# Patient Record
Sex: Male | Born: 1950 | Race: White | Hispanic: No | Marital: Married | Smoking: Never smoker
Health system: Southern US, Community
[De-identification: ages and names within clinical notes are randomized; demographics above are authoritative.]

## PROBLEM LIST (undated history)

## (undated) DIAGNOSIS — F429 Obsessive-compulsive disorder, unspecified: Secondary | ICD-10-CM

## (undated) DIAGNOSIS — N400 Enlarged prostate without lower urinary tract symptoms: Secondary | ICD-10-CM

## (undated) DIAGNOSIS — E7800 Pure hypercholesterolemia, unspecified: Secondary | ICD-10-CM

## (undated) DIAGNOSIS — G4733 Obstructive sleep apnea (adult) (pediatric): Secondary | ICD-10-CM

## (undated) DIAGNOSIS — R7989 Other specified abnormal findings of blood chemistry: Secondary | ICD-10-CM

## (undated) HISTORY — PX: OTHER SURGICAL HISTORY: SHX169

---

## 2000-03-20 ENCOUNTER — Emergency Department (HOSPITAL_COMMUNITY): Admission: EM | Admit: 2000-03-20 | Discharge: 2000-03-20 | Payer: Self-pay | Admitting: Emergency Medicine

## 2000-03-20 ENCOUNTER — Encounter: Payer: Self-pay | Admitting: Emergency Medicine

## 2015-06-19 ENCOUNTER — Observation Stay (HOSPITAL_COMMUNITY)
Admission: EM | Admit: 2015-06-19 | Discharge: 2015-06-20 | Disposition: A | Discharge status: Home / Self Care | Payer: BC Managed Care – PPO | Attending: Surgery | Admitting: Surgery

## 2015-06-19 ENCOUNTER — Encounter (HOSPITAL_COMMUNITY): Payer: Self-pay | Admitting: Emergency Medicine

## 2015-06-19 ENCOUNTER — Encounter (HOSPITAL_COMMUNITY)
Admission: EM | Disposition: A | Discharge status: Home / Self Care | Payer: Self-pay | Source: Home / Self Care | Attending: Emergency Medicine

## 2015-06-19 ENCOUNTER — Observation Stay (HOSPITAL_COMMUNITY): Payer: BC Managed Care – PPO | Admitting: Anesthesiology

## 2015-06-19 DIAGNOSIS — G4733 Obstructive sleep apnea (adult) (pediatric): Secondary | ICD-10-CM

## 2015-06-19 DIAGNOSIS — Z79899 Other long term (current) drug therapy: Secondary | ICD-10-CM | POA: Diagnosis not present

## 2015-06-19 DIAGNOSIS — G4730 Sleep apnea, unspecified: Secondary | ICD-10-CM | POA: Diagnosis not present

## 2015-06-19 DIAGNOSIS — E785 Hyperlipidemia, unspecified: Secondary | ICD-10-CM | POA: Insufficient documentation

## 2015-06-19 DIAGNOSIS — R7989 Other specified abnormal findings of blood chemistry: Secondary | ICD-10-CM

## 2015-06-19 DIAGNOSIS — K4030 Unilateral inguinal hernia, with obstruction, without gangrene, not specified as recurrent: Principal | ICD-10-CM | POA: Diagnosis present

## 2015-06-19 DIAGNOSIS — N400 Enlarged prostate without lower urinary tract symptoms: Secondary | ICD-10-CM

## 2015-06-19 DIAGNOSIS — F429 Obsessive-compulsive disorder, unspecified: Secondary | ICD-10-CM | POA: Diagnosis present

## 2015-06-19 HISTORY — DX: Obstructive sleep apnea (adult) (pediatric): G47.33

## 2015-06-19 HISTORY — PX: INGUINAL HERNIA REPAIR: SHX194

## 2015-06-19 HISTORY — DX: Benign prostatic hyperplasia without lower urinary tract symptoms: N40.0

## 2015-06-19 HISTORY — DX: Other specified abnormal findings of blood chemistry: R79.89

## 2015-06-19 HISTORY — DX: Obsessive-compulsive disorder, unspecified: F42.9

## 2015-06-19 HISTORY — DX: Pure hypercholesterolemia, unspecified: E78.00

## 2015-06-19 LAB — COMPREHENSIVE METABOLIC PANEL
ALT: 22 U/L (ref 17–63)
AST: 22 U/L (ref 15–41)
Albumin: 4.6 g/dL (ref 3.5–5.0)
Alkaline Phosphatase: 69 U/L (ref 38–126)
Anion gap: 11 (ref 5–15)
BUN: 21 mg/dL — ABNORMAL HIGH (ref 6–20)
CO2: 27 mmol/L (ref 22–32)
Calcium: 9.4 mg/dL (ref 8.9–10.3)
Chloride: 105 mmol/L (ref 101–111)
Creatinine, Ser: 1.05 mg/dL (ref 0.61–1.24)
GFR calc Af Amer: >60 mL/min (ref >60)
GFR calc non Af Amer: >60 mL/min (ref >60)
Glucose, Bld: 121 mg/dL — ABNORMAL HIGH (ref 65–99)
Potassium: 4.1 mmol/L (ref 3.5–5.1)
Sodium: 143 mmol/L (ref 135–145)
Total Bilirubin: 0.7 mg/dL (ref 0.3–1.2)
Total Protein: 7.2 g/dL (ref 6.5–8.1)

## 2015-06-19 LAB — CBC
HCT: 45.8 % (ref 39.0–52.0)
Hemoglobin: 15.1 g/dL (ref 13.0–17.0)
MCH: 28.9 pg (ref 26.0–34.0)
MCHC: 33 g/dL (ref 30.0–36.0)
MCV: 87.6 fL (ref 78.0–100.0)
PLATELETS: 166 10*3/uL (ref 150–400)
RBC: 5.23 MIL/uL (ref 4.22–5.81)
RDW: 13.6 % (ref 11.5–15.5)
WBC: 5.5 10*3/uL (ref 4.0–10.5)

## 2015-06-19 LAB — LIPASE, BLOOD: Lipase: 151 U/L — ABNORMAL HIGH (ref 11–51)

## 2015-06-19 SURGERY — REPAIR, HERNIA, INGUINAL, ADULT
Anesthesia: General | Site: Groin | Laterality: Left

## 2015-06-19 MED ORDER — SODIUM CHLORIDE 0.9 % IV SOLN
INTRAVENOUS | Status: DC
  Administered 2015-06-19: 14:00:00 via INTRAVENOUS

## 2015-06-19 MED ORDER — PHENYLEPHRINE HCL 10 MG/ML IJ SOLN
INTRAMUSCULAR | Status: DC | PRN
  Administered 2015-06-19 (×5): 40 ug via INTRAVENOUS

## 2015-06-19 MED ORDER — DIPHENHYDRAMINE HCL 50 MG/ML IJ SOLN: 25.0000 mg | Freq: Four times a day (QID) | INTRAMUSCULAR | Status: DC | PRN

## 2015-06-19 MED ORDER — HYDROMORPHONE HCL 1 MG/ML IJ SOLN: 0.2500 mg | INTRAMUSCULAR | Status: DC | PRN

## 2015-06-19 MED ORDER — LIDOCAINE HCL (CARDIAC) 20 MG/ML IV SOLN
INTRAVENOUS | Status: DC | PRN
  Administered 2015-06-19: 100 mg via INTRAVENOUS

## 2015-06-19 MED ORDER — ONDANSETRON 4 MG PO TBDP: 4.0000 mg | ORAL_TABLET | Freq: Four times a day (QID) | ORAL | Status: DC | PRN

## 2015-06-19 MED ORDER — SUCCINYLCHOLINE CHLORIDE 20 MG/ML IJ SOLN
INTRAMUSCULAR | Status: DC | PRN
  Administered 2015-06-19: 100 mg via INTRAVENOUS

## 2015-06-19 MED ORDER — ONDANSETRON HCL 4 MG/2ML IJ SOLN
4.0000 mg | Freq: Once | INTRAMUSCULAR | Status: AC
  Administered 2015-06-19: 4 mg via INTRAVENOUS
  Filled 2015-06-19: qty 2

## 2015-06-19 MED ORDER — LACTATED RINGERS IV SOLN
INTRAVENOUS | Status: DC | PRN
  Administered 2015-06-19 (×2): via INTRAVENOUS

## 2015-06-19 MED ORDER — ONDANSETRON HCL 4 MG/2ML IJ SOLN: 4.0000 mg | Freq: Four times a day (QID) | INTRAMUSCULAR | Status: DC | PRN

## 2015-06-19 MED ORDER — 0.9 % SODIUM CHLORIDE (POUR BTL) OPTIME
TOPICAL | Status: DC | PRN
  Administered 2015-06-19: 2000 mL

## 2015-06-19 MED ORDER — SUGAMMADEX SODIUM 200 MG/2ML IV SOLN
INTRAVENOUS | Status: AC
  Filled 2015-06-19: qty 2

## 2015-06-19 MED ORDER — SUGAMMADEX SODIUM 200 MG/2ML IV SOLN
INTRAVENOUS | Status: DC | PRN
  Administered 2015-06-19: 200 mg via INTRAVENOUS

## 2015-06-19 MED ORDER — SODIUM CHLORIDE 0.9 % IJ SOLN
INTRAMUSCULAR | Status: DC | PRN
  Administered 2015-06-19: 10 mL

## 2015-06-19 MED ORDER — IBUPROFEN 600 MG PO TABS
600.0000 mg | ORAL_TABLET | Freq: Four times a day (QID) | ORAL | Status: DC | PRN
  Filled 2015-06-19: qty 1

## 2015-06-19 MED ORDER — LACTATED RINGERS IV SOLN: INTRAVENOUS | Status: DC

## 2015-06-19 MED ORDER — FENTANYL CITRATE (PF) 100 MCG/2ML IJ SOLN
INTRAMUSCULAR | Status: AC
  Filled 2015-06-19: qty 2

## 2015-06-19 MED ORDER — POTASSIUM CHLORIDE IN NACL 20-0.9 MEQ/L-% IV SOLN: INTRAVENOUS | Status: DC

## 2015-06-19 MED ORDER — BUPIVACAINE LIPOSOME 1.3 % IJ SUSP
20.0000 mL | Freq: Once | INTRAMUSCULAR | Status: AC
  Administered 2015-06-19: 20 mL
  Filled 2015-06-19: qty 20

## 2015-06-19 MED ORDER — CETYLPYRIDINIUM CHLORIDE 0.05 % MT LIQD
7.0000 mL | Freq: Two times a day (BID) | OROMUCOSAL | Status: DC
  Administered 2015-06-19: 7 mL via OROMUCOSAL

## 2015-06-19 MED ORDER — MIDAZOLAM HCL 5 MG/5ML IJ SOLN
INTRAMUSCULAR | Status: DC | PRN
  Administered 2015-06-19: 2 mg via INTRAVENOUS

## 2015-06-19 MED ORDER — DIPHENHYDRAMINE HCL 25 MG PO CAPS: 25.0000 mg | ORAL_CAPSULE | Freq: Four times a day (QID) | ORAL | Status: DC | PRN

## 2015-06-19 MED ORDER — FENTANYL CITRATE (PF) 100 MCG/2ML IJ SOLN
INTRAMUSCULAR | Status: DC | PRN
  Administered 2015-06-19: 100 ug via INTRAVENOUS

## 2015-06-19 MED ORDER — KCL IN DEXTROSE-NACL 20-5-0.45 MEQ/L-%-% IV SOLN
INTRAVENOUS | Status: DC
  Administered 2015-06-19: 20:00:00 via INTRAVENOUS
  Filled 2015-06-19 (×3): qty 1000

## 2015-06-19 MED ORDER — DEXAMETHASONE SODIUM PHOSPHATE 10 MG/ML IJ SOLN
INTRAMUSCULAR | Status: DC | PRN
  Administered 2015-06-19: 10 mg via INTRAVENOUS

## 2015-06-19 MED ORDER — PROPOFOL 10 MG/ML IV BOLUS
INTRAVENOUS | Status: DC | PRN
  Administered 2015-06-19: 200 mg via INTRAVENOUS

## 2015-06-19 MED ORDER — HYDROMORPHONE HCL 1 MG/ML IJ SOLN
1.0000 mg | Freq: Once | INTRAMUSCULAR | Status: AC
  Administered 2015-06-19: 1 mg via INTRAVENOUS
  Filled 2015-06-19: qty 1

## 2015-06-19 MED ORDER — ROCURONIUM BROMIDE 100 MG/10ML IV SOLN
INTRAVENOUS | Status: DC | PRN
  Administered 2015-06-19: 40 mg via INTRAVENOUS
  Administered 2015-06-19: 10 mg via INTRAVENOUS
  Administered 2015-06-19: 30 mg via INTRAVENOUS

## 2015-06-19 MED ORDER — ONDANSETRON HCL 4 MG/2ML IJ SOLN
4.0000 mg | Freq: Four times a day (QID) | INTRAMUSCULAR | Status: DC | PRN
  Administered 2015-06-19: 4 mg via INTRAVENOUS

## 2015-06-19 MED ORDER — SODIUM CHLORIDE 0.9 % IJ SOLN
INTRAMUSCULAR | Status: AC
  Filled 2015-06-19: qty 50

## 2015-06-19 MED ORDER — MORPHINE SULFATE (PF) 10 MG/ML IV SOLN: 1.0000 mg | INTRAVENOUS | Status: DC | PRN

## 2015-06-19 MED ORDER — CEFAZOLIN SODIUM-DEXTROSE 2-3 GM-% IV SOLR
INTRAVENOUS | Status: AC
  Filled 2015-06-19: qty 50

## 2015-06-19 MED ORDER — HYDROMORPHONE HCL 1 MG/ML IJ SOLN: 1.0000 mg | INTRAMUSCULAR | Status: DC | PRN

## 2015-06-19 MED ORDER — FENTANYL CITRATE (PF) 250 MCG/5ML IJ SOLN
INTRAMUSCULAR | Status: AC
  Filled 2015-06-19: qty 5

## 2015-06-19 MED ORDER — MIDAZOLAM HCL 2 MG/2ML IJ SOLN
INTRAMUSCULAR | Status: AC
  Filled 2015-06-19: qty 2

## 2015-06-19 MED ORDER — HYDROCODONE-ACETAMINOPHEN 5-325 MG PO TABS: 1.0000 | ORAL_TABLET | ORAL | Status: DC | PRN

## 2015-06-19 MED ORDER — CEFAZOLIN SODIUM-DEXTROSE 2-3 GM-% IV SOLR
2.0000 g | INTRAVENOUS | Status: AC
  Administered 2015-06-19: 2 g via INTRAVENOUS
  Filled 2015-06-19: qty 50

## 2015-06-19 SURGICAL SUPPLY — 42 items
BENZOIN TINCTURE PRP APPL 2/3 (GAUZE/BANDAGES/DRESSINGS) ×3 IMPLANT
BLADE HEX COATED 2.75 (ELECTRODE) ×3 IMPLANT
BLADE SURG 15 STRL LF DISP TIS (BLADE) IMPLANT
BLADE SURG 15 STRL SS (BLADE)
BLADE SURG SZ10 CARB STEEL (BLADE) ×6 IMPLANT
CLOSURE WOUND 1/2 X4 (GAUZE/BANDAGES/DRESSINGS) ×1
COVER SURGICAL LIGHT HANDLE (MISCELLANEOUS) ×3 IMPLANT
DECANTER SPIKE VIAL GLASS SM (MISCELLANEOUS) ×3 IMPLANT
DISSECTOR ROUND CHERRY 3/8 STR (MISCELLANEOUS) ×3 IMPLANT
DRAIN PENROSE 18X1/2 LTX STRL (DRAIN) ×3 IMPLANT
DRAPE LAPAROTOMY TRNSV 102X78 (DRAPE) ×3 IMPLANT
ELECT PENCIL ROCKER SW 15FT (MISCELLANEOUS) ×3 IMPLANT
ELECT REM PT RETURN 9FT ADLT (ELECTROSURGICAL) ×3
ELECTRODE REM PT RTRN 9FT ADLT (ELECTROSURGICAL) ×1 IMPLANT
GAUZE SPONGE 4X4 12PLY STRL (GAUZE/BANDAGES/DRESSINGS) ×3 IMPLANT
GLOVE BIOGEL PI IND STRL 7.0 (GLOVE) ×1 IMPLANT
GLOVE BIOGEL PI INDICATOR 7.0 (GLOVE) ×2
GLOVE SURG SIGNA 7.5 PF LTX (GLOVE) ×3 IMPLANT
GOWN SPEC L4 XLG W/TWL (GOWN DISPOSABLE) ×3 IMPLANT
GOWN STRL REUS W/ TWL XL LVL3 (GOWN DISPOSABLE) ×3 IMPLANT
GOWN STRL REUS W/TWL LRG LVL3 (GOWN DISPOSABLE) ×3 IMPLANT
GOWN STRL REUS W/TWL XL LVL3 (GOWN DISPOSABLE) ×6
KIT BASIN OR (CUSTOM PROCEDURE TRAY) ×3 IMPLANT
LIQUID BAND (GAUZE/BANDAGES/DRESSINGS) ×3 IMPLANT
MESH ULTRAPRO 3X6 7.6X15CM (Mesh General) ×3 IMPLANT
NEEDLE HYPO 25X1 1.5 SAFETY (NEEDLE) ×3 IMPLANT
NS IRRIG 1000ML POUR BTL (IV SOLUTION) ×3 IMPLANT
PACK BASIC VI WITH GOWN DISP (CUSTOM PROCEDURE TRAY) ×3 IMPLANT
SPONGE LAP 18X18 X RAY DECT (DISPOSABLE) ×3 IMPLANT
SPONGE LAP 4X18 X RAY DECT (DISPOSABLE) ×3 IMPLANT
STRIP CLOSURE SKIN 1/2X4 (GAUZE/BANDAGES/DRESSINGS) ×2 IMPLANT
SUT CHROMIC 0 SH (SUTURE) IMPLANT
SUT MNCRL AB 4-0 PS2 18 (SUTURE) IMPLANT
SUT NOVA 0 T19/GS 22DT (SUTURE) ×9 IMPLANT
SUT VIC AB 0 CT2 27 (SUTURE) ×3 IMPLANT
SUT VIC AB 2-0 SH 27 (SUTURE) ×6
SUT VIC AB 2-0 SH 27X BRD (SUTURE) ×3 IMPLANT
SUT VIC AB 3-0 SH 18 (SUTURE) ×3 IMPLANT
SYR BULB IRRIGATION 50ML (SYRINGE) ×3 IMPLANT
SYR CONTROL 10ML LL (SYRINGE) ×3 IMPLANT
TOWEL OR 17X26 10 PK STRL BLUE (TOWEL DISPOSABLE) ×3 IMPLANT
YANKAUER SUCT BULB TIP 10FT TU (MISCELLANEOUS) ×3 IMPLANT

## 2015-06-19 NOTE — Op Note (Signed)
06/19/2015  5:48 PM  PATIENT:  Jim Campbell, 65 y.o., male, MRN: [id number]  PREOP DIAGNOSIS:  Incarcerated left inguinal hernia  POSTOP DIAGNOSIS:   Incarcerated left inguinal hernia  PROCEDURE:   Procedure(s):  LEFT INGUINAL HERNIA REPAIR  WITH MESH  SURGEON:   Alphonsa Overall, M.D.  ANESTHESIA:   general  Anesthesiologist: Rod Mae, MD CRNA: Glory Buff, CRNA  General  EBL:  minimal  ml  LOCAL MEDICATIONS USED:   20 cc Exparel (mixed with 10 cc of saline)  SPECIMEN:   Incarcerated preperitoneal fat  COUNTS CORRECT:  YES  INDICATIONS FOR PROCEDURE:  [name] is a 65 y.o. (DOB: 01/25/1951) white  male whose primary care physician is GATES,ROBERT NEVILL, MD and comes for open left inguinal hernia.   He presented to the [facility] with an incarcerated left inguinal hernia.  He said that his symptoms began today.   The indications and risks of the hernia surgery were explained to the patient.  The risks include, but are not limited to, infection, bleeding, recurrence of the hernia, and nerve injury.  Operative Note: The patient was taken to room number 1 at [facility].  He underwent a general anesthesia.  After he was under general anesthesia, I was able to reduce the left inguinal hernia.  A time out was held and the surgical checklist run.  His lower abdomen was shaved and then prepped with chloroprep.  A left inguinal incision was made through the subcutaneous fat to the external oblique fascia.  The external ring was opened and the cord structures encircled with a penrose drain.  The patient had an indirect left inguinal hernia.  I identified the ileo inguinal nerve and preserved this during the dissection.  The external ring was opened and the cord structures encircled with a penrose drain.  The patient had an indirect inguinal hernia. The patient had a large mass of preperitoneal fat with hemorrhage that made up most of the hernia.  There was a small sac the came  along the anterior medial portion of the cord structures, but what I found was mainly fat (lipoma).  There was no  sac to dissect.  The fat (lipoma) was amputated with 2-0 vicryl.  He had no evidence of a direct inguinal hernia.  The inguinal floor was repaired with a 3 x 6 inch piece of Ultrapro mesh.  The mesh was cut to fit the inguinal floor.  The mesh was sewn in place with interrupted 0 Novafil suture.  A key hole was made for the internal ring.  The mesh lay flat.  The inguinal floor was covered and the internal ring recreated.  The cord structures were returned to a normal location.  The external oblique was closed with a 3-0 vicryl.  The fascia and subcutaneous tissues were infiltrated with 20 cc of Exparel (mixed with 10 cc of saline).  The skin was closed with 4-0 monocryl and painted with LiquidBand. The sponge and needle count were correct at the end of the case.  The patient was transported to the recovery room in good condition.  I will keep the patient overnight because of the incarcerated portion of his presentation.  Alphonsa Overall, MD, [facility] Surgery Pager: [phone] Office phone:  [phone]

## 2015-06-19 NOTE — Transfer of Care (Signed)
Immediate Anesthesia Transfer of Care Note  Patient: Jim Campbell  Procedure(s) Performed: Procedure(s): LEFT INGUINAL HERNIA REPAIR  WITH MESH (Left)  Patient Location: PACU  Anesthesia Type:General  Level of Consciousness: awake, alert  and oriented  Airway & Oxygen Therapy: Patient Spontanous Breathing and Patient connected to face mask oxygen  Post-op Assessment: Report given to RN and Post -op Vital signs reviewed and stable  Post vital signs: Reviewed and stable  Last Vitals:  Filed Vitals:   06/19/15 1351 06/19/15 1506  BP: 130/82 135/81  Pulse: 80 77  Temp: 36.8 C   Resp: 18 16    Complications: No apparent anesthesia complications

## 2015-06-19 NOTE — ED Notes (Signed)
Rn starting IV and drawing labs

## 2015-06-19 NOTE — ED Provider Notes (Signed)
CSN: [id number]     Arrival date & time 06/19/15  1336 History   First MD Initiated Contact with Patient 06/19/15 1359     Chief Complaint  Patient presents with  . Abdominal Pain  . Testicle Pain     (Consider location/radiation/quality/duration/timing/severity/associated sxs/prior Treatment) HPI Comments: Patient here complaining of sudden onset of left groin swelling that began approximately 9:30 this morning. Denies any hematuria or dysuria. Follow a large hard mass that started in his left inguinal canal and goes down to his left scrotum. No prior history of same. Denies any prior history of varicoceles or hydroceles Pain is characterized as sharp and worse with movement. Denies any flank pain. No vomiting. No tracheal use prior to arrival  Patient is a 65 y.o. male presenting with abdominal pain and testicular pain. The history is provided by the patient and the spouse.  Abdominal Pain Testicle Pain Associated symptoms include abdominal pain.    Past Medical History  Diagnosis Date  . High cholesterol    History reviewed. No pertinent past surgical history. History reviewed. No pertinent family history. Social History  Substance Use Topics  . Smoking status: None  . Smokeless tobacco: None  . Alcohol Use: None    Review of Systems  Gastrointestinal: Positive for abdominal pain.  Genitourinary: Positive for testicular pain.  All other systems reviewed and are negative.     Allergies  Review of patient's allergies indicates no known allergies.  Home Medications   Prior to Admission medications   Not on File   BP 130/82 mmHg  Pulse 80  Temp(Src) 98.2 F (36.8 C) (Oral)  Resp 18  SpO2 94% Physical Exam  Constitutional: He is oriented to person, place, and time. He appears well-developed and well-nourished.  Non-toxic appearance. No distress.  HENT:  Head: Normocephalic and atraumatic.  Eyes: Conjunctivae, EOM and lids are normal. Pupils are equal, round, and  reactive to light.  Neck: Normal range of motion. Neck supple. No tracheal deviation present. No thyroid mass present.  Cardiovascular: Normal rate, regular rhythm and normal heart sounds.  Exam reveals no gallop.   No murmur heard. Pulmonary/Chest: Effort normal and breath sounds normal. No stridor. No respiratory distress. He has no decreased breath sounds. He has no wheezes. He has no rhonchi. He has no rales.  Abdominal: Soft. Normal appearance and bowel sounds are normal. He exhibits no distension. There is no tenderness. There is no rebound and no CVA tenderness. A hernia is present. Hernia confirmed positive in the left inguinal area.  Genitourinary:    Left testis shows mass, swelling and tenderness. Circumcised.  Musculoskeletal: Normal range of motion. He exhibits no edema or tenderness.  Lymphadenopathy:       Left: No inguinal adenopathy present.  Neurological: He is alert and oriented to person, place, and time. He has normal strength. No cranial nerve deficit or sensory deficit. GCS eye subscore is 4. GCS verbal subscore is 5. GCS motor subscore is 6.  Skin: Skin is warm and dry. No abrasion and no rash noted.  Psychiatric: He has a normal mood and affect. His speech is normal and behavior is normal.  Nursing note and vitals reviewed.   ED Course  Procedures (including critical care time) Labs Review Labs Reviewed  LIPASE, BLOOD  COMPREHENSIVE METABOLIC PANEL  CBC  URINALYSIS, ROUTINE W REFLEX MICROSCOPIC (NOT AT [facility])    Imaging Review No results found. I have personally reviewed and evaluated these images and lab results as part  of my medical decision-making.   EKG Interpretation None      MDM   Final diagnoses:  None    Urgent consult placed to general surgery for incarcerated inguinal hernia. They have seen the patient and he is to go to the operating room    Lacretia Leigh, MD 06/19/15 1520

## 2015-06-19 NOTE — ED Notes (Signed)
While at work patient twisted to look at something then felt a large amount of pain to the left groin/abdominal/testicle area that has been steadily increasing since. Pt states he can feel a large hard mass to groin that he believes is a hernia.

## 2015-06-19 NOTE — H&P (Signed)
Jim Campbell is an 65 y.o. male.   PCP: [name] Chief Complaint: left groin pain  HPI:  Pt at work this AM and twisted then felt large amount of pain, left groin, abdomen and testicle.  Mass is hard and concerned he had a hernia.  Symptoms started around 8:30-9:30 PM.    He never had pain or was aware of issues here before this AM, he is quite sure he was normal this AM with his shower.   Seen in the ED by Dr. Lacretia Leigh and based on exam is concerned he has an incarcerated LIH. On exam now he has a large tender, non reducible mass in the scrotum.    Past Medical History  Diagnosis Date  . High cholesterol Sleep apnea with CPAP OCD Low testosterone      Past Surgical History  Procedure Laterality Date  . None      Family History  Problem Relation Age of Onset  . Memory loss Mother    Social History:  has no tobacco, alcohol, and drug history on file.  Allergies: No Known Allergies  Prior to Admission medications   Medication Sig Start Date End Date Taking? Authorizing Provider  ezetimibe (ZETIA) 10 MG tablet Take 10 mg by mouth daily. 06/01/15  Yes Historical Provider, MD  fluvoxaMINE (LUVOX) 25 MG tablet Take 50 mg by mouth at bedtime. 04/05/15  Yes Historical Provider, MD  rosuvastatin (CRESTOR) 20 MG tablet Take 20 mg by mouth daily. 06/01/15  Yes Historical Provider, MD  Testosterone 12.5 MG/ACT (1%) GEL 5 pumps once daily as directed 06/07/15  Yes Historical Provider, MD  CIALIS 5 MG tablet Take 5 mg by mouth daily as needed. 03/24/15   Historical Provider, MD     Results for orders placed or performed during the hospital encounter of 06/19/15 (from the past 48 hour(s))  CBC     Status: None   Collection Time: 06/19/15  2:39 PM  Result Value Ref Range   WBC 5.5 4.0 - 10.5 K/uL   RBC 5.23 4.22 - 5.81 MIL/uL   Hemoglobin 15.1 13.0 - 17.0 g/dL   HCT 45.8 39.0 - 52.0 %   MCV 87.6 78.0 - 100.0 fL   MCH 28.9 26.0 - 34.0 pg   MCHC 33.0 30.0 - 36.0 g/dL   RDW  13.6 11.5 - 15.5 %   Platelets 166 150 - 400 K/uL   No results found.  Review of Systems  Constitutional: Negative.   Eyes: Negative.   Respiratory: Negative.        Sleep apnea with CPAP  Cardiovascular: Negative.   Gastrointestinal: Positive for abdominal pain (Pain from testicle up to LLQ). Negative for heartburn, nausea, vomiting, diarrhea, constipation, blood in stool and melena.  Genitourinary: Negative.        Slow stream with voiding.  Musculoskeletal: Positive for neck pain (see chiropracter for this, much better).  Neurological: Negative.   Endo/Heme/Allergies: Negative.   Psychiatric/Behavioral: Negative.        Hx of OCD    Blood pressure 135/81, pulse 77, temperature 98.2 F (36.8 C), temperature source Oral, resp. rate 16, SpO2 99 %. Physical Exam  Constitutional: He is oriented to person, place, and time. He appears well-developed and well-nourished. No distress.  He is very tender Left groin and testicle  HENT:  Head: Normocephalic and atraumatic.  Nose: Nose normal.  Eyes: Conjunctivae and EOM are normal. Right eye exhibits no discharge. Left eye exhibits no discharge. No scleral  icterus.  Neck: Neck supple. No JVD present. No tracheal deviation present. No thyromegaly present.  Cardiovascular: Normal rate, regular rhythm, normal heart sounds and intact distal pulses.   No murmur heard. Respiratory: Effort normal and breath sounds normal. No respiratory distress. He has no wheezes. He has no rales. He exhibits no tenderness.  GI: Soft. Bowel sounds are normal. He exhibits no distension (he is tender Left lower groin going down to his testicle) and no mass. There is tenderness. There is no rebound and no guarding.  Genitourinary:  Tenderness and swelling left groin, with hard 10+ cm of bowel in the scrotum. I cannot reduce it, nor could Dr. Zenia Resides.    Musculoskeletal: He exhibits no edema or tenderness.  Lymphadenopathy:    He has no cervical adenopathy.   Neurological: He is alert and oriented to person, place, and time. No cranial nerve deficit.  Skin: Skin is warm and dry. No rash noted. He is not diaphoretic. No erythema. No pallor.  Psychiatric: He has a normal mood and affect. His behavior is normal. Judgment and thought content normal.     Assessment/Plan Incarcerated LIH OCD Sleep apnea with CPAP Hyperlipidemia  Plan:  He has been seen and examined by Dr. Lucia Gaskins, he as not able to reduce the hernia either.  We plan to take him to the OR for open repair and possible laparoscopy ASAP.  [name],[name], PA-C 06/19/2015, 3:13 PM  Agree with above. His wife is at the bedside.  I discussed the indications and complications of hernia surgery with the patient.  I discussed both the laparoscopic and open approach to hernia repair.  I think that he is best served with an open operation. The potential risks of hernia surgery include, but are not limited to, bleeding, infection, open surgery, nerve injury, and recurrence of the hernia.    Alphonsa Overall, MD, [facility] Surgery Pager: [phone] Office phone:  [phone]

## 2015-06-19 NOTE — Anesthesia Preprocedure Evaluation (Addendum)
Anesthesia Evaluation  Patient identified by MRN, date of birth, ID band Patient awake    Reviewed: Allergy & Precautions, H&P , NPO status , Patient's Chart, lab work & pertinent test results  Airway Mallampati: II  TM Distance: >3 FB Neck ROM: full    Dental no notable dental hx. (+) Dental Advisory Given, Teeth Intact   Pulmonary neg pulmonary ROS, sleep apnea and Continuous Positive Airway Pressure Ventilation ,    Pulmonary exam normal breath sounds clear to auscultation       Cardiovascular Exercise Tolerance: Good negative cardio ROS Normal cardiovascular exam Rhythm:regular Rate:Normal     Neuro/Psych negative neurological ROS  negative psych ROS   GI/Hepatic negative GI ROS, Neg liver ROS,   Endo/Other  negative endocrine ROS  Renal/GU negative Renal ROS  negative genitourinary   Musculoskeletal   Abdominal   Peds  Hematology negative hematology ROS (+)   Anesthesia Other Findings   Reproductive/Obstetrics negative OB ROS                             Anesthesia Physical Anesthesia Plan  ASA: III  Anesthesia Plan: General   Post-op Pain Management:    Induction: Intravenous  Airway Management Planned: Nasal ETT  Additional Equipment:   Intra-op Plan:   Post-operative Plan: Extubation in OR  Informed Consent: I have reviewed the patients History and Physical, chart, labs and discussed the procedure including the risks, benefits and alternatives for the proposed anesthesia with the patient or authorized representative who has indicated his/her understanding and acceptance.   Dental Advisory Given  Plan Discussed with: CRNA and Surgeon  Anesthesia Plan Comments:        Anesthesia Quick Evaluation

## 2015-06-19 NOTE — Anesthesia Postprocedure Evaluation (Signed)
Anesthesia Post Note  Patient: Jim Campbell  Procedure(s) Performed: Procedure(s) (LRB): LEFT INGUINAL HERNIA REPAIR  WITH MESH (Left)  Patient location during evaluation: PACU Anesthesia Type: General Level of consciousness: awake and alert Pain management: pain level controlled Vital Signs Assessment: post-procedure vital signs reviewed and stable Respiratory status: spontaneous breathing, nonlabored ventilation, respiratory function stable and patient connected to nasal cannula oxygen Cardiovascular status: blood pressure returned to baseline and stable Postop Assessment: no signs of nausea or vomiting Anesthetic complications: no    Last Vitals:  Filed Vitals:   06/19/15 1940 06/19/15 2100  BP: 121/77 126/75  Pulse: 88 89  Temp: 36.8 C 36.5 C  Resp: 16 18    Last Pain:  Filed Vitals:   06/19/15 2115  PainSc: 4                  Magnus Crescenzo L

## 2015-06-19 NOTE — Anesthesia Procedure Notes (Signed)
Procedure Name: Intubation Date/Time: 06/19/2015 3:49 PM Performed by: Glory Buff Pre-anesthesia Checklist: Patient identified, Emergency Drugs available, Suction available and Patient being monitored Patient Re-evaluated:Patient Re-evaluated prior to inductionOxygen Delivery Method: Circle System Utilized Preoxygenation: Pre-oxygenation with 100% oxygen Intubation Type: IV induction Ventilation: Mask ventilation without difficulty Laryngoscope Size: Miller and 3 Grade View: Grade III Tube type: Oral Tube size: 7.5 mm Number of attempts: 2 Airway Equipment and Method: Oral airway and Bougie stylet Placement Confirmation: ETT inserted through vocal cords under direct vision,  positive ETCO2 and breath sounds checked- equal and bilateral Secured at: 22 cm Tube secured with: Tape Dental Injury: Teeth and Oropharynx as per pre-operative assessment  Difficulty Due To: Difficulty was unanticipated and Difficult Airway- due to anterior larynx Future Recommendations: Recommend- induction with short-acting agent, and alternative techniques readily available

## 2015-06-20 ENCOUNTER — Encounter (HOSPITAL_COMMUNITY): Payer: Self-pay | Admitting: Surgery

## 2015-06-20 ENCOUNTER — Encounter: Payer: Self-pay | Admitting: General Surgery

## 2015-06-20 LAB — CBC WITH DIFFERENTIAL/PLATELET
Basophils Absolute: 0 10*3/uL (ref 0.0–0.1)
Basophils Relative: 0 %
EOS PCT: 0 %
Eosinophils Absolute: 0 10*3/uL (ref 0.0–0.7)
HEMATOCRIT: 45 % (ref 39.0–52.0)
Hemoglobin: 14.6 g/dL (ref 13.0–17.0)
LYMPHS ABS: 0.8 10*3/uL (ref 0.7–4.0)
LYMPHS PCT: 7 %
MCH: 28.7 pg (ref 26.0–34.0)
MCHC: 32.4 g/dL (ref 30.0–36.0)
MCV: 88.4 fL (ref 78.0–100.0)
MONO ABS: 0.6 10*3/uL (ref 0.1–1.0)
MONOS PCT: 5 %
NEUTROS ABS: 9.7 10*3/uL — AB (ref 1.7–7.7)
Neutrophils Relative %: 88 %
Platelets: 177 10*3/uL (ref 150–400)
RBC: 5.09 MIL/uL (ref 4.22–5.81)
RDW: 13.8 % (ref 11.5–15.5)
WBC: 11 10*3/uL — ABNORMAL HIGH (ref 4.0–10.5)

## 2015-06-20 MED ORDER — HYDROCODONE-ACETAMINOPHEN 5-325 MG PO TABS: 1.0000 | ORAL_TABLET | ORAL | Status: DC | PRN

## 2015-06-20 MED ORDER — IBUPROFEN 200 MG PO TABS: ORAL_TABLET | ORAL | Status: DC

## 2015-06-20 MED ORDER — ACETAMINOPHEN 325 MG PO TABS
650.0000 mg | ORAL_TABLET | Freq: Four times a day (QID) | ORAL | Status: DC | PRN
Start: 2015-06-20 — End: 2024-03-25

## 2015-06-20 NOTE — Progress Notes (Signed)
1 Day Post-Op  Subjective: He feels fine ate a regular breakfast and not having much pain.  Objective: Vital signs in last 24 hours: Temp:  [97.5 F (36.4 C)-99.6 F (37.6 C)] 97.5 F (36.4 C) (02/07 0532) Pulse Rate:  [77-96] 96 (02/07 0532) Resp:  [15-19] 18 (02/07 0532) BP: (113-141)/(67-87) 129/69 mmHg (02/07 0532) SpO2:  [94 %-100 %] 96 % (02/07 0532) Weight:  [93.9 kg (207 lb 0.2 oz)] 93.9 kg (207 lb 0.2 oz) (02/06 1906) Last BM Date: 06/18/15 Nothing PO recorded  Regular diet 2400 urine+ Afebrile, VSS WBC up some 11K Intake/Output from previous day: 02/06 0701 - 02/07 0700 In: 1325 [I.V.:1325] Out: 2420 [Urine:2400; Blood:20] Intake/Output this shift:    General appearance: alert, cooperative and no distress GI: soft sore, site looks great.    + BS regular breakfast this AM.  Lab Results:   Recent Labs  06/19/15 1439 06/20/15 0454  WBC 5.5 11.0*  HGB 15.1 14.6  HCT 45.8 45.0  PLT 166 177    BMET  Recent Labs  06/19/15 1439  NA 143  K 4.1  CL 105  CO2 27  GLUCOSE 121*  BUN 21*  CREATININE 1.05  CALCIUM 9.4   PT/INR No results for input(s): LABPROT, INR in the last 72 hours.   Recent Labs Lab 06/19/15 1439  AST 22  ALT 22  ALKPHOS 69  BILITOT 0.7  PROT 7.2  ALBUMIN 4.6     Lipase     Component Value Date/Time   LIPASE 151* 06/19/2015 1439     Studies/Results: No results found.  Medications: . antiseptic oral rinse  7 mL Mouth Rinse BID    Assessment/Plan Incarcerated LIH  LEFT INGUINAL HERNIA REPAIR WITH MESH, 06/19/15 Dr. Lucia Gaskins  OCD Sleep apnea with CPAP Hyperlipidemia Antibiotics:  Pre op only DVT:  SCD    Plan:  Home today follow up with Dr. Lucia Gaskins in 2-3 weeks.       Campbell,Jim 06/20/2015  Agree with above. 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  Alphonsa Overall, MD, [facility] Pager: [phone] Office phone:  [phone]

## 2015-06-20 NOTE — Progress Notes (Signed)
Patient's vitals were WNL, tolerating diet and pain was under control. Discussed discharge instructions with both patient and wife. Discharged home with prescriptions.

## 2015-06-20 NOTE — Discharge Instructions (Signed)
[facility] _______[facility]  UMBILICAL OR INGUINAL HERNIA REPAIR: POST OP INSTRUCTIONS  Always review your discharge instruction sheet given to you by the facility where your surgery was performed. IF YOU HAVE DISABILITY OR FAMILY LEAVE FORMS, YOU MUST BRING THEM TO THE OFFICE FOR PROCESSING.   DO NOT GIVE THEM TO YOUR DOCTOR.  1. A  prescription for pain medication may be given to you upon discharge.  Take your pain medication as prescribed, if needed.  If narcotic pain medicine is not needed, then you may take acetaminophen (Tylenol) or ibuprofen (Advil) as needed. 2. Take your usually prescribed medications unless otherwise directed. 3. If you need a refill on your pain medication, please contact your pharmacy.  They will contact our office to request authorization. Prescriptions will not be filled after 5 pm or on week-ends. 4. You should follow a light diet the first 24 hours after arrival home, such as soup and crackers, etc.  Be sure to include lots of fluids daily.  Resume your normal diet the day after surgery. 5. Most patients will experience some swelling and bruising around the umbilicus or in the groin and scrotum.  Ice packs and reclining will help.  Swelling and bruising can take several days to resolve.  6. It is common to experience some constipation if taking pain medication after surgery.  Increasing fluid intake and taking a stool softener (such as Colace) will usually help or prevent this problem from occurring.  A mild laxative (Milk of Magnesia or Miralax) should be taken according to package directions if there are no bowel movements after 48 hours. 7. Unless discharge instructions indicate otherwise, you may remove your bandages 24-48 hours after surgery, and you may shower at that time.  You may have steri-strips (small skin tapes) in place directly over the incision.  These strips should be left on the skin for 7-10 days.  If your surgeon used skin glue on the  incision, you may shower in 24 hours.  The glue will flake off over the next 2-3 weeks.  Any sutures or staples will be removed at the office during your follow-up visit. 8. ACTIVITIES:  You may resume regular (light) daily activities beginning the next day--such as daily self-care, walking, climbing stairs--gradually increasing activities as tolerated.  You may have sexual intercourse when it is comfortable.  Refrain from any heavy lifting or straining until approved by your doctor. a. You may drive when you are no longer taking prescription pain medication, you can comfortably wear a seatbelt, and you can safely maneuver your car and apply brakes. b. RETURN TO WORK:  __________________________________________________________ 9. You should see your doctor in the office for a follow-up appointment approximately 2-3 weeks after your surgery.  Make sure that you call for this appointment within a day or two after you arrive home to insure a convenient appointment time. 10. OTHER INSTRUCTIONS:  __________________________________________________________________________________________________________________________________________________________________________________________  WHEN TO CALL YOUR DOCTOR: 1. Fever over 101.0 2. Inability to urinate 3. Nausea and/or vomiting 4. Extreme swelling or bruising 5. Continued bleeding from incision. 6. Increased pain, redness, or drainage from the incision  The clinic staff is available to answer your questions during regular business hours.  Please don't hesitate to call and ask to speak to one of the nurses for clinical concerns.  If you have a medical emergency, go to the nearest emergency room or call 911.  A surgeon from [facility] is always on call at the hospital     [address], [address], [place], [place]  [address] ?  [address] [address], [place],    [address] [phone] ? [phone] ? FAX [phone] Web site:  [email or web address]  

## 2015-06-20 NOTE — Care Management Note (Signed)
Case Management Note  Patient Details  Name: Jim Campbell MRN: [id number] Date of Birth: 01-01-1951  Subjective/Objective:    Admitted with Incarcerated left inguinal hernia, s/p repair                Action/Plan: Discharge planning, no HH needs identified  Expected Discharge Date:                  Expected Discharge Plan:     In-House Referral:     Discharge planning Services     Post Acute Care Choice:    Choice offered to:     DME Arranged:    DME Agency:     HH Arranged:    [place] Agency:     Status of Service:     Medicare Important Message Given:    Date Medicare IM Given:    Medicare IM give by:    Date Additional Medicare IM Given:    Additional Medicare Important Message give by:     If discussed at [place] of Stay Meetings, dates discussed:    Additional Comments:  Guadalupe Maple, RN 06/20/2015, 11:39 AM [phone]

## 2015-06-22 NOTE — Discharge Summary (Signed)
Physician Discharge Summary  Patient ID: Jim Campbell MRN: [id number] DOB/AGE: 10/01/1950 65 y.o.  Admit date: 06/19/2015 Discharge date: 06/20/2015  Admission Diagnoses:  Incarcerated LIH OCD Sleep apnea with CPAP Hyperlipidemia  Discharge Diagnoses:  Same    Principal Problem:   Incarcerated left inguinal hernia Active Problems:   OCD (obsessive compulsive disorder)   Sleep apnea, obstructive   Low testosterone   BPH (benign prostatic hyperplasia)   PROCEDURES: Left inguinal hernia repair with Mesh 06/19/15, Dr. Darci Current Course: Pt at work this AM and twisted then felt large amount of pain, left groin, abdomen and testicle. Mass is hard and concerned he had a hernia. Symptoms started around 8:30-9:30 PM. He never had pain or was aware of issues here before this AM, he is quite sure he was normal this AM with his shower.  Seen in the ED by Dr. Lacretia Leigh and based on exam is concerned he has an incarcerated LIH. On exam now he has a large tender, non reducible mass in the scrotum.   He was admitted and taken to the OR by Dr. Lucia Gaskins.  The hernia reduced easily once he was under general anesthesia.  The hernia was repaired and he was returned to the floor.  We did not view the bowel and he was observed him post op over night and thru the AM of the first post op day.  He tolerated his diet well, had no bowel pain issues post op.  He was discharged home later the first post op day.  Condition on d/c:  Improved     Disposition: 01-Home or Self Care     Medication List    TAKE these medications        acetaminophen 325 MG tablet  Commonly known as:  TYLENOL  Take 2 tablets (650 mg total) by mouth every 6 (six) hours as needed for mild pain, moderate pain, fever or headache (do not take more than 4000 mg of Tylenol (acetaminophen) per day.  it is in prescribed pain pill.).     aspirin EC 81 MG tablet  Take 81 mg by mouth daily.     CIALIS 5 MG tablet   Generic drug:  tadalafil  Take 5 mg by mouth daily as needed.     ezetimibe 10 MG tablet  Commonly known as:  ZETIA  Take 10 mg by mouth daily.     fluvoxaMINE 25 MG tablet  Commonly known as:  LUVOX  Take 50 mg by mouth at bedtime.     HYDROcodone-acetaminophen 5-325 MG tablet  Commonly known as:  NORCO/VICODIN  Take 1-2 tablets by mouth every 4 (four) hours as needed for moderate pain.     ibuprofen 200 MG tablet  Commonly known as:  ADVIL,MOTRIN  You can take 2-3 tablets every 6 hours as needed for pain.     rosuvastatin 20 MG tablet  Commonly known as:  CRESTOR  Take 20 mg by mouth daily.     Testosterone 12.5 MG/ACT (1%) Gel  5 pumps once daily as directed       Follow-up Information    Follow up with [facility], MD.   Specialty:  General Surgery   Why:  Call for an appoinment in 2-3 weeks.   Contact information:   [address] [place] [address] [phone]       Follow up with Henrine Screws, MD.   Specialty:  Internal Medicine   Why:  Let  them know you had surgery, follow up for Medical issues.   Contact information:   [address] [place] [address] [phone]       Signed: Earnstine Regal 06/22/2015, 12:35 PM  Agree with above.  Alphonsa Overall, MD, [facility] Pager: [phone] Office phone:  [phone]

## 2016-01-22 ENCOUNTER — Encounter: Payer: Self-pay | Admitting: Podiatry

## 2016-01-22 ENCOUNTER — Ambulatory Visit (INDEPENDENT_AMBULATORY_CARE_PROVIDER_SITE_OTHER): Payer: BC Managed Care – PPO

## 2016-01-22 ENCOUNTER — Ambulatory Visit (INDEPENDENT_AMBULATORY_CARE_PROVIDER_SITE_OTHER): Payer: BC Managed Care – PPO | Admitting: Podiatry

## 2016-01-22 VITALS — BP 134/77 | HR 76 | Resp 16 | Ht 72.0 in | Wt 194.0 lb

## 2016-01-22 DIAGNOSIS — D3610 Benign neoplasm of peripheral nerves and autonomic nervous system, unspecified: Secondary | ICD-10-CM

## 2016-01-22 DIAGNOSIS — M79672 Pain in left foot: Secondary | ICD-10-CM

## 2016-01-22 DIAGNOSIS — M79671 Pain in right foot: Secondary | ICD-10-CM | POA: Diagnosis not present

## 2016-01-22 DIAGNOSIS — M21619 Bunion of unspecified foot: Secondary | ICD-10-CM | POA: Diagnosis not present

## 2016-01-22 DIAGNOSIS — M779 Enthesopathy, unspecified: Secondary | ICD-10-CM | POA: Diagnosis not present

## 2016-01-22 DIAGNOSIS — G5761 Lesion of plantar nerve, right lower limb: Secondary | ICD-10-CM

## 2016-01-22 MED ORDER — TRIAMCINOLONE ACETONIDE 10 MG/ML IJ SUSP
10.0000 mg | Freq: Once | INTRAMUSCULAR | Status: AC
  Administered 2016-01-22: 10 mg

## 2016-01-22 NOTE — Progress Notes (Signed)
   Subjective:    Patient ID: Jim Campbell, male    DOB: [birth year], 65 y.o.   MRN: [id number]  HPI  Chief Complaint  Patient presents with  . Foot Pain    left great toe joint and 2nd toe dorsel x 1 yr.  . Foot Pain    right 3rd and 4th toes burning and stinging "feels like a sock is underneath"       Review of Systems  All other systems reviewed and are negative.      Objective:   Physical Exam        Assessment & Plan:

## 2016-01-23 NOTE — Progress Notes (Signed)
Subjective:     Patient ID: Jim Campbell, male   DOB: August 11, 1950, 65 y.o.   MRN: [id number]  HPI patient presents stating I have had a lot of pain in my forefoot left and then shooting pain in my right foot and also I have painful bunions of both feet that I tried wider shoes and other modalities and no probably all have to get corrected some day   Review of Systems  All other systems reviewed and are negative.      Objective:   Physical Exam  Constitutional: He is oriented to person, place, and time.  Cardiovascular: Intact distal pulses.   Musculoskeletal: Normal range of motion.  Neurological: He is oriented to person, place, and time.  Skin: Skin is warm.  Nursing note and vitals reviewed.  neurovascular status is found to be intact with muscle strength adequate range of motion within normal limits. Patient's found to have inflammation and pain around the second metatarsophalangeal joint left with fluid buildup and is found to have intense discomfort third interspace right foot with radiating discomfort. Patient's found to have structural deformity of both first metatarsal heads with redness and pain     Assessment:     Probable inflammatory capsulitis left second MPJ along with neuroma symptomatology right and large structural bunion deformity bilateral    Plan:     H&P conditions reviewed and recommended long-term consideration of correction. Today I want to try to get him better and I did do a proximal nerve block left aspirated second MPJ getting out of small amount of clear fluid and injected with a quarter cc deck Smith some Kenalog and applied padding and for the right did a injection of the third interspace with a alcohol Marcaine solution and advised on how this does over the next several weeks. Reappoint 2 weeks to recheck  X-ray report indicates there is structural bunion deformity bilateral with patient noted to have inflammatory changes around the second MPJ left but  no indication stress fracture arthritis

## 2016-02-05 ENCOUNTER — Ambulatory Visit (INDEPENDENT_AMBULATORY_CARE_PROVIDER_SITE_OTHER): Payer: BC Managed Care – PPO | Admitting: Podiatry

## 2016-02-05 ENCOUNTER — Encounter: Payer: Self-pay | Admitting: Podiatry

## 2016-02-05 DIAGNOSIS — G5762 Lesion of plantar nerve, left lower limb: Secondary | ICD-10-CM

## 2016-02-05 DIAGNOSIS — M21619 Bunion of unspecified foot: Secondary | ICD-10-CM | POA: Diagnosis not present

## 2016-02-05 DIAGNOSIS — M779 Enthesopathy, unspecified: Secondary | ICD-10-CM

## 2016-02-05 DIAGNOSIS — D3610 Benign neoplasm of peripheral nerves and autonomic nervous system, unspecified: Secondary | ICD-10-CM

## 2016-02-07 NOTE — Progress Notes (Signed)
Subjective:     Patient ID: Jim Campbell, male   DOB: 23-Aug-1950, 65 y.o.   MRN: [id number]  HPI patient states I'm still getting some shooting pains in my right third interspace and also I do have structural bunions on both feet that my wife wants me to do something about   Review of Systems     Objective:   Physical Exam Neurovascular status intact muscle strength adequate with inflammation right third interspace with fluid buildup and shooting pains into the adjacent digits along with structural bunion deformity bilateral which becomes moderately painful    Assessment:     Neuroma symptomatology with pinch nerve third interspace right along with structural deformity    Plan:     Careful injection administered right third interspace with a purified alcohol Marcaine solution and advised on wider shoes and soaks and reappoint as needed with consideration of bunion correction which I educated him on at this time

## 2017-05-14 ENCOUNTER — Other Ambulatory Visit: Payer: Self-pay

## 2017-05-14 ENCOUNTER — Emergency Department (HOSPITAL_BASED_OUTPATIENT_CLINIC_OR_DEPARTMENT_OTHER)
Admission: EM | Admit: 2017-05-14 | Discharge: 2017-05-15 | Disposition: A | Discharge status: Home / Self Care | Payer: BC Managed Care – PPO | Attending: Emergency Medicine | Admitting: Emergency Medicine

## 2017-05-14 ENCOUNTER — Encounter (HOSPITAL_BASED_OUTPATIENT_CLINIC_OR_DEPARTMENT_OTHER): Payer: Self-pay

## 2017-05-14 DIAGNOSIS — Y92410 Unspecified street and highway as the place of occurrence of the external cause: Secondary | ICD-10-CM | POA: Diagnosis not present

## 2017-05-14 DIAGNOSIS — Z7982 Long term (current) use of aspirin: Secondary | ICD-10-CM | POA: Insufficient documentation

## 2017-05-14 DIAGNOSIS — Y9389 Activity, other specified: Secondary | ICD-10-CM | POA: Diagnosis not present

## 2017-05-14 DIAGNOSIS — Z79899 Other long term (current) drug therapy: Secondary | ICD-10-CM | POA: Diagnosis not present

## 2017-05-14 DIAGNOSIS — M542 Cervicalgia: Secondary | ICD-10-CM | POA: Diagnosis present

## 2017-05-14 DIAGNOSIS — Y999 Unspecified external cause status: Secondary | ICD-10-CM | POA: Diagnosis not present
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
## 2017-05-15 NOTE — ED Notes (Signed)
ED Provider at bedside. 
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
## 2019-03-01 ENCOUNTER — Encounter (INDEPENDENT_AMBULATORY_CARE_PROVIDER_SITE_OTHER): Payer: BC Managed Care – PPO | Admitting: Ophthalmology

## 2019-03-01 ENCOUNTER — Other Ambulatory Visit: Payer: Self-pay

## 2019-03-01 DIAGNOSIS — H2513 Age-related nuclear cataract, bilateral: Secondary | ICD-10-CM

## 2019-03-01 DIAGNOSIS — H43813 Vitreous degeneration, bilateral: Secondary | ICD-10-CM

## 2019-08-19 ENCOUNTER — Other Ambulatory Visit: Payer: Self-pay | Admitting: Internal Medicine

## 2019-08-19 DIAGNOSIS — R1900 Intra-abdominal and pelvic swelling, mass and lump, unspecified site: Secondary | ICD-10-CM

## 2019-09-03 ENCOUNTER — Other Ambulatory Visit: Payer: BC Managed Care – PPO

## 2019-10-12 ENCOUNTER — Encounter: Payer: Self-pay | Admitting: Neurology
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
## 2020-01-03 ENCOUNTER — Ambulatory Visit: Payer: BC Managed Care – PPO | Admitting: Neurology

## 2020-01-03 ENCOUNTER — Encounter: Payer: Self-pay | Admitting: Neurology

## 2020-01-03 ENCOUNTER — Other Ambulatory Visit: Payer: Self-pay

## 2020-01-03 VITALS — BP 168/91 | HR 79 | Ht 72.0 in | Wt 200.8 lb

## 2020-01-03 DIAGNOSIS — G8929 Other chronic pain: Secondary | ICD-10-CM

## 2020-01-03 DIAGNOSIS — M546 Pain in thoracic spine: Secondary | ICD-10-CM | POA: Diagnosis not present

## 2020-01-03 DIAGNOSIS — M5414 Radiculopathy, thoracic region: Secondary | ICD-10-CM | POA: Diagnosis not present

## 2020-01-03 NOTE — Patient Instructions (Addendum)
1.  We will check MRI of thoracic spine. We have sent a referral to [facility] for your MRI and they will call you directly to schedule your appointment. They are located at [place]. If you need to contact them directly please call [phone].  2.  Further recommendations pending results.

## 2020-01-11 ENCOUNTER — Other Ambulatory Visit: Payer: Self-pay

## 2020-01-11 ENCOUNTER — Ambulatory Visit
Admission: RE | Admit: 2020-01-11 | Discharge: 2020-01-11 | Disposition: A | Discharge status: Home / Self Care | Payer: BC Managed Care – PPO | Source: Ambulatory Visit | Attending: Neurology | Admitting: Neurology

## 2020-01-11 DIAGNOSIS — M5414 Radiculopathy, thoracic region: Secondary | ICD-10-CM

## 2020-01-13 ENCOUNTER — Telehealth: Payer: Self-pay

## 2020-01-13 NOTE — Telephone Encounter (Signed)
Called patient and informed him of results. Patient stated that he has an appt with his pcp and will inform him of results to see if there are any other options for determining the cause of his symptoms. Patient verbalized understanding of his results.

## 2020-01-13 NOTE — Telephone Encounter (Signed)
-----   Message from Pieter Partridge, DO sent at 01/13/2020 10:37 AM EDT ----- MRI shows a couple of mild disc bulges but no significant involvement of nerves that would explain his symptoms. I really don't think it is neurologic.

## 2021-04-16 IMAGING — MR MR THORACIC SPINE W/O CM
4 of 6 series · 12 of 48 positions shown · non-contrast
Comparison: None.

CLINICAL DATA: Back injury with left-sided flank pain, bilateral
leg weakness and hip weakness.

EXAM:
MRI THORACIC SPINE WITHOUT CONTRAST
TECHNIQUE: Multiplanar, multisequence MR imaging of the thoracic spine was
performed. No intravenous contrast was administered.

[Series 6: T2 · sagittal · 4.0mm · 0.39mm/px · 3 of 15 slices shown (1 of 3)]
[im 3/15]
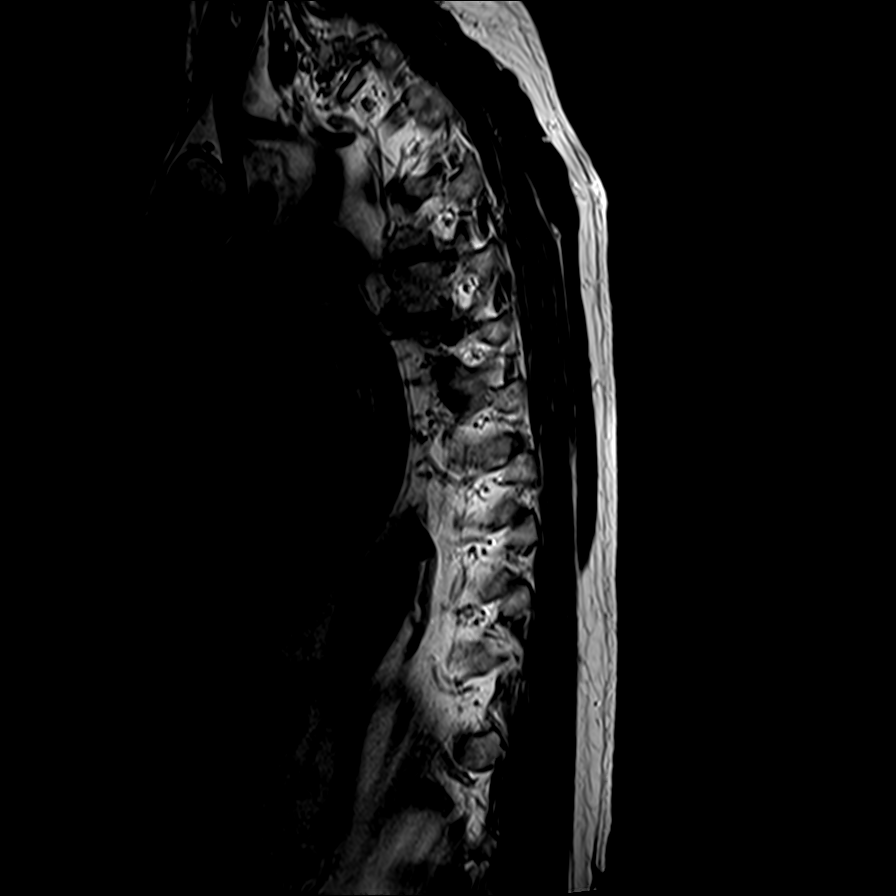
[im 9/15]
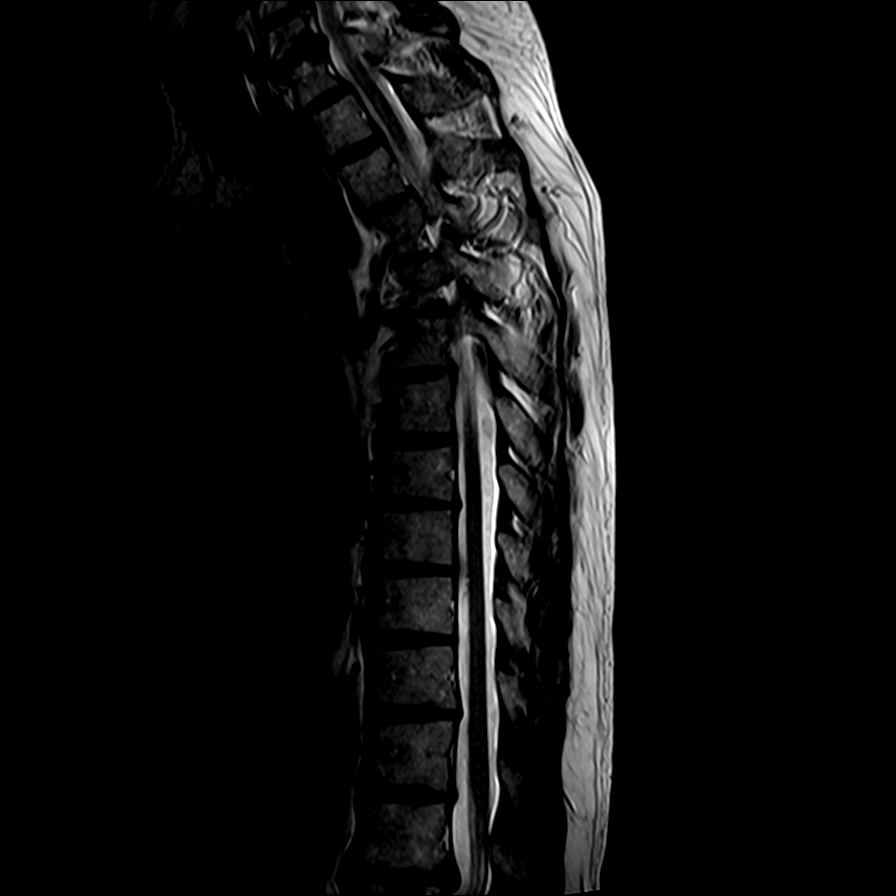
[im 15/15]
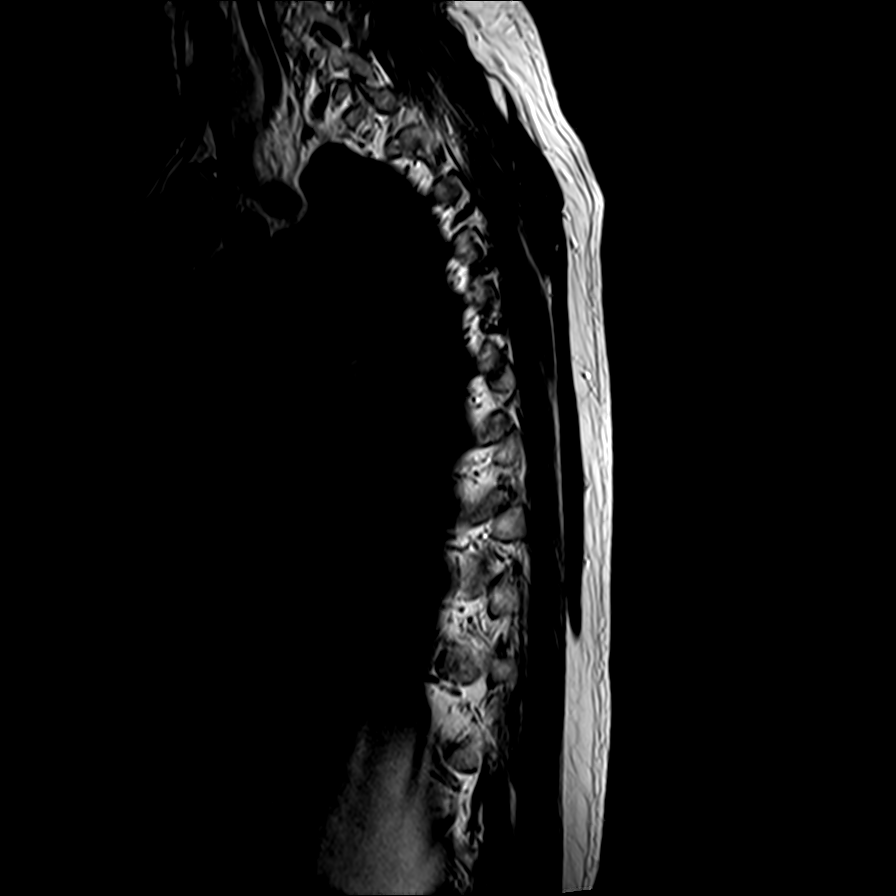

[Series 8: T1 · sagittal · 4.0mm · 0.46mm/px · 3 of 15 slices shown]
[im 3/15]
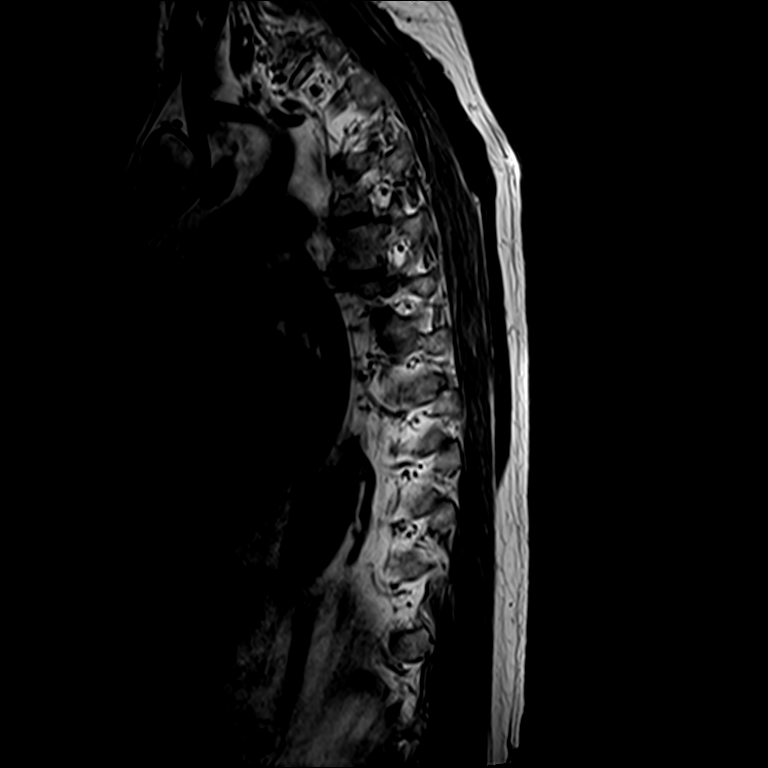
[im 9/15]
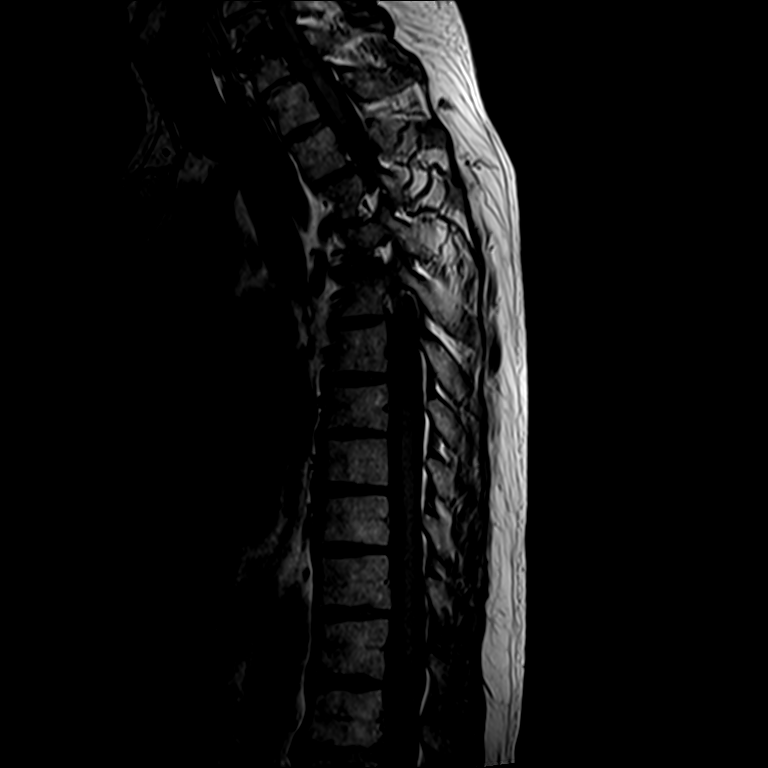
[im 15/15]
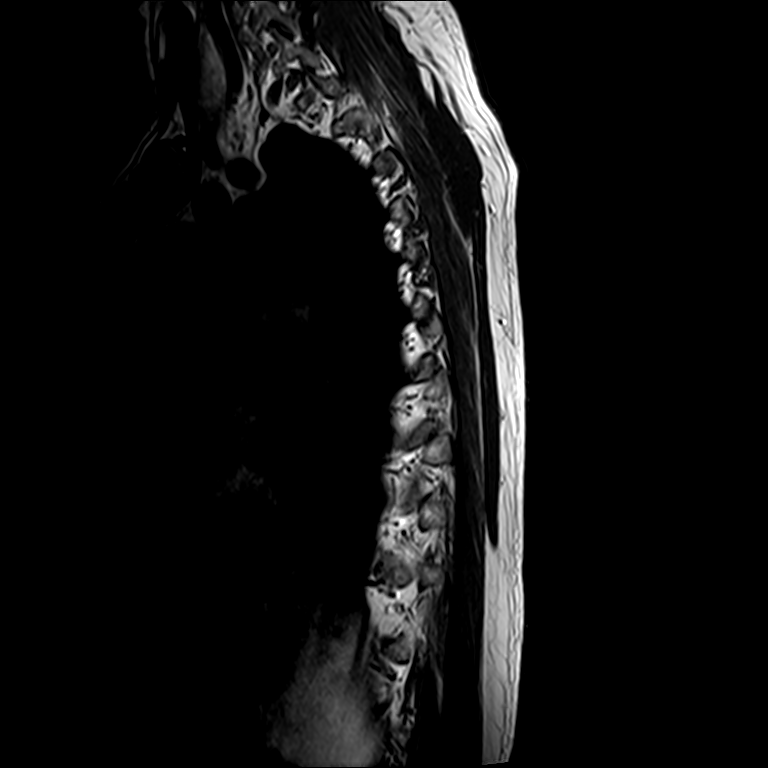

[Series 9: T2 · axial · 4.0mm · 0.78mm/px · z∈[-129,+34]mm · 3 of 33 slices shown (2 of 3)]
[im 6/33]
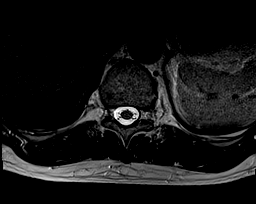
[im 17/33]
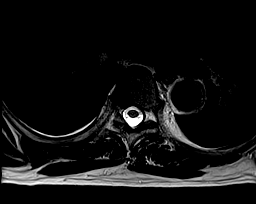
[im 27/33]
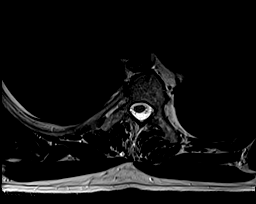

[Series 10: T2 · axial · 4.0mm · 0.78mm/px · z∈[-129,+34]mm · 3 of 33 slices shown (3 of 3)]
[im 6/33]
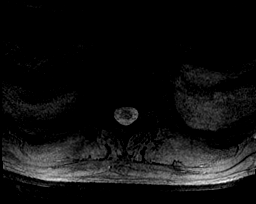
[im 17/33]
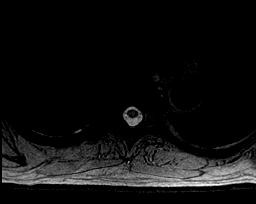
[im 27/33]
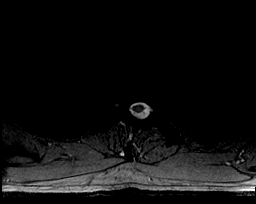

[12 of 48 positions shown; findings below may reference images not displayed]

FINDINGS: Alignment:  Curvature convex to the left with the apex at T4-5.

Vertebrae: No evidence of regional fracture. Mild discogenic edema
of the endplates on the right at T4-5. Mild discogenic edema of the
endplates on the left at T7-8.

Cord:  No cord compression or primary cord lesion.

Paraspinal and other soft tissues: Negative

Disc levels:

C7-T1: Facet osteoarthritis worse on the left with 2 mm of
anterolisthesis. Left foraminal narrowing could affect the C8 nerve.

T1-2: Mild noncompressive disc bulge. Facet osteoarthritis on the
left with mild left foraminal narrowing that could affect the left
T1 nerve.

T2-3: Minimal noncompressive disc bulge.  No stenosis.

T3-4: Normal interspace.

T4-5: Disc degeneration more pronounced on the right. Endplate
osteophytes and mild bulging of the disc. Mild right foraminal
narrowing, not likely compressive.

T5-6: Mild disc bulge towards the right.  No compressive stenosis.

T6-7: Minimal noncompressive disc bulge.

T7-8: Mild noncompressive disc bulge.

T8-9: Small left posterolateral disc herniation indents thecal sac
but does not appear to cause neural compression. Some potential this
could irritate the left T8 nerve.

T9-10: Mild noncompressive disc bulge.

T10-11: Mild noncompressive disc bulge.

T11-12: Left posterolateral disc herniation indents the thecal sac.
Neural compression is not demonstrated, but there is some potential
this could irritate the left T11 nerve.

T12-L1: Disc bulge.  No apparent neural compression.
IMPRESSION: 1. Curvature convex to the left with the apex at T4-5.
2. Left-sided facet osteoarthritis at C7-T1 with left foraminal
narrowing that could affect the left C8 nerve.
3. Left foraminal narrowing at T1-2 because of facet arthropathy.
4. Small left posterolateral disc herniations at T8-9 and T11-12. No
definite neural compression however. Some potential this could
irritate the left T8 and/or T11 nerves however.

## 2024-03-25 ENCOUNTER — Ambulatory Visit (HOSPITAL_COMMUNITY)
Admission: RE | Admit: 2024-03-25 | Discharge: 2024-03-25 | Disposition: A | Discharge status: Home / Self Care | Payer: Self-pay | Source: Ambulatory Visit | Attending: Internal Medicine | Admitting: Internal Medicine

## 2024-03-25 ENCOUNTER — Encounter (HOSPITAL_COMMUNITY): Payer: Self-pay | Admitting: Internal Medicine

## 2024-03-25 VITALS — BP 148/90 | HR 61 | Ht 72.0 in | Wt 205.8 lb

## 2024-03-25 DIAGNOSIS — I4891 Unspecified atrial fibrillation: Secondary | ICD-10-CM

## 2024-03-25 DIAGNOSIS — I480 Paroxysmal atrial fibrillation: Secondary | ICD-10-CM | POA: Diagnosis not present

## 2024-03-25 NOTE — Progress Notes (Signed)
 Primary Care Physician: Delice Charleston, MD (Inactive) Primary Cardiologist: None Electrophysiologist: None     Referring Physician: PCP     Jim Campbell is a 73 y.o. male with a history of GERD, HLD, OSA on CPAP, history of atrial fibrillation in 1993 secondary to steroid prescription, and palpitations who presents for consultation in the [facility]. Cardiac monitor placed by PCP showed 2% Afib burden. Patient is on ASA for stroke prevention.  On evaluation today, patient is currently in NSR.  Patient notes current stressful situation with working at home and taking care of his 35 year old mother-in-law.  He does take metoprolol daily and is compliant with this medication.  He is compliant with his CPAP.  Today, he denies symptoms of palpitations, chest pain, shortness of breath, orthopnea, PND, lower extremity edema, dizziness, presyncope, syncope, bleeding, or neurologic sequela. The patient is tolerating medications without difficulties and is otherwise without complaint today.    Atrial Fibrillation Risk Factors:  he does have symptoms or diagnosis of sleep apnea. he is compliant with CPAP therapy.   he has a BMI of Body mass index is 27.91 kg/m.SABRA Filed Weights   03/25/24 0859  Weight: 93.4 kg    Current Outpatient Medications  Medication Sig Dispense Refill   aspirin EC 81 MG tablet Take 81 mg by mouth daily.     CIALIS 5 MG tablet Take 5 mg by mouth daily as needed.  2   ezetimibe (ZETIA) 10 MG tablet Take 10 mg by mouth daily.  2   fluvoxaMINE (LUVOX) 50 MG tablet Take 50 mg by mouth 2 (two) times daily.     metoprolol succinate (TOPROL-XL) 25 MG 24 hr tablet Take 25 mg by mouth at bedtime.     rosuvastatin (CRESTOR) 40 MG tablet Take 40 mg by mouth daily.     Testosterone 12.5 MG/ACT (1%) GEL 5 pumps once daily as directed  5   No current facility-administered medications for this encounter.    Atrial Fibrillation Management  history:  Previous antiarrhythmic drugs: none Previous cardioversions: none Previous ablations: none Anticoagulation history: none   ROS- All systems are reviewed and negative except as per the HPI above.  Physical Exam: BP (!) 148/90   Pulse 61   Ht 6' (1.829 m)   Wt 93.4 kg   BMI 27.91 kg/m   GEN: Well nourished, well developed in no acute distress NECK: No JVD; No carotid bruits CARDIAC: Regular rate and rhythm, no murmurs, rubs, gallops RESPIRATORY:  Clear to auscultation without rales, wheezing or rhonchi  ABDOMEN: Soft, non-tender, non-distended EXTREMITIES:  No edema; No deformity   EKG today demonstrates  Vent. rate 61 BPM PR interval 154 ms QRS duration 78 ms QT/QTcB 390/392 ms P-R-T axes 71 41 50 Normal sinus rhythm  No previous ECGs available  ASSESSMENT & PLAN CHA2DS2-VASc Score = 1  The patient's score is based upon: CHF History: 0 HTN History: 0 Diabetes History: 0 Stroke History: 0 Vascular Disease History: 0 Age Score: 1 Gender Score: 0       ASSESSMENT AND PLAN: Paroxysmal Atrial Fibrillation (ICD10:  I48.0) The patient's CHA2DS2-VASc score is 1, indicating a 0.6% annual risk of stroke.    Patient is currently in NSR.  Education given about A-fib and options for treatment going forward in the future.  After discussion patient elects to not proceed with any intervention at this time.  He would like to continue metoprolol daily for rate control.  Will  order baseline echocardiogram.  He is compliant with CPAP.  He does not wish to have a follow-up visit at this time but will call the clinic if needed in the future.  Due to low risk score for stroke, patient declines anticoagulation at this time and prefers to continue aspirin daily.    Follow up Afib clinic prn.   Terra Pac, [facility]  Afib Clinic [address] [place], [place] [address] [phone]

## 2024-03-25 NOTE — Patient Instructions (Signed)
 Echocardiogram -- scheduling will call once insurance authorization received.

## 2024-03-26 ENCOUNTER — Other Ambulatory Visit (HOSPITAL_COMMUNITY): Payer: Self-pay

## 2024-03-26 DIAGNOSIS — I480 Paroxysmal atrial fibrillation: Secondary | ICD-10-CM

## 2024-05-11 ENCOUNTER — Ambulatory Visit (HOSPITAL_COMMUNITY)
Admission: RE | Admit: 2024-05-11 | Discharge: 2024-05-11 | Disposition: A | Discharge status: Home / Self Care | Source: Ambulatory Visit | Attending: Internal Medicine | Admitting: Internal Medicine

## 2024-05-11 DIAGNOSIS — I4891 Unspecified atrial fibrillation: Secondary | ICD-10-CM

## 2024-05-11 DIAGNOSIS — I480 Paroxysmal atrial fibrillation: Secondary | ICD-10-CM | POA: Insufficient documentation

## 2024-05-11 LAB — ECHOCARDIOGRAM COMPLETE
Area-P 1/2: 4.21 cm2
P 1/2 time: 551 ms
S' Lateral: 3.3 cm

## 2024-05-12 ENCOUNTER — Ambulatory Visit (HOSPITAL_COMMUNITY): Payer: Self-pay | Admitting: Internal Medicine

## 2024-05-12 ENCOUNTER — Other Ambulatory Visit (HOSPITAL_COMMUNITY): Payer: Self-pay | Admitting: *Deleted

## 2024-05-12 DIAGNOSIS — I080 Rheumatic disorders of both mitral and aortic valves: Secondary | ICD-10-CM

## 2024-05-17 NOTE — Progress Notes (Unsigned)
 " Cardiology Office Note:    Date:  05/19/2024   ID:  Jim Campbell, DOB 1950/10/31, MRN [id number]  PCP:  Cleotilde Rico BRAVO, PA   [facility] Providers Cardiologist:  None     Referring MD: Terra Fairy PARAS, PA-C   Chief Complaint  Patient presents with   Atrial Fibrillation   Hypertension    History of Present Illness:    Jim Campbell is a 74 y.o. male is seen at the request of Dr Terra for evaluation of valvular heart disease and Afib. He has a history of HLD, OSA on CPAP. He has remote history of Afib in the early 1990s. More recently event monitor placed by PCP showed Afib burden of 2%. Was seen in Afib clinic. Continued metoprolol for rate control. CHAD Vasc score of 1 so deferred anticoagulation and continued on ASA. Echo done showing normal LV function with mild AI and MR.   He states he feels well. Minimal palpitations. Denies any chest pain or SOB. Has been compliant with CPAP for 10 years. BP has not been well controlled. Notes he is stressed a lot. Works from home and cares for 26 yo mother in law who has dementia. Very little time to exercise. Does try to eat healthy. Notes more palpitations when he is full. There appears to be family history of familial hypercholesterolemia with father and son. Son has had genetic testing. His Crestor dose was recently increased from 20 to 40 mg but this did not result in any change.   Past Medical History:  Diagnosis Date   BPH (benign prostatic hyperplasia) 06/19/2015   High cholesterol    Low testosterone 06/19/2015   OCD (obsessive compulsive disorder)    Sleep apnea, obstructive    he uses CPAP at home   Sleep apnea, obstructive 06/19/2015   He uses CPAP at home     Past Surgical History:  Procedure Laterality Date   INGUINAL HERNIA REPAIR Left 06/19/2015   Procedure: LEFT INGUINAL HERNIA REPAIR  WITH MESH;  Surgeon: Alm Angle, MD;  Location: [facility];  Service: General;  Laterality: Left;   none      Current  Medications: Active Medications[1]   Allergies:   Niacin   Social History   Socioeconomic History   Marital status: Married    Spouse name: Not on file   Number of children: 2   Years of education: Not on file   Highest education level: Not on file  Occupational History   Not on file  Tobacco Use   Smoking status: Never   Smokeless tobacco: Never   Tobacco comments:    Never smoked 03/25/24  Substance and Sexual Activity   Alcohol use: No    Alcohol/week: 0.0 standard drinks of alcohol   Drug use: Never   Sexual activity: Not on file  Other Topics Concern   Not on file  Social History Narrative   Right handed   Lives with wife in one story home   Social Drivers of Health   Tobacco Use: Low Risk (05/19/2024)   Patient History    Smoking Tobacco Use: Never    Smokeless Tobacco Use: Never    Passive Exposure: Not on file  Financial Resource Strain: Not on file  Food Insecurity: Not on file  Transportation Needs: Not on file  Physical Activity: Not on file  Stress: Not on file  Social Connections: Not on file  Depression (EYV7-0): Not on file  Alcohol Screen: Not on  file  Housing: Not on file  Utilities: Not on file  Health Literacy: Not on file     Family History: The patient's family history includes Arrhythmia in his maternal grandmother; Crohn's disease in his mother; Heart attack in his paternal grandmother; Heart disease in his paternal aunt; Hyperlipidemia in his mother; Memory loss in his mother.  ROS:   Please see the history of present illness.     All other systems reviewed and are negative.  EKGs/Labs/Other Studies Reviewed:    The following studies were reviewed today: Echo 05/11/24: IMPRESSIONS     1. Left ventricular ejection fraction, by estimation, is 60 to 65%. The  left ventricle has normal function. The left ventricle has no regional  wall motion abnormalities. There is mild concentric left ventricular  hypertrophy. Left ventricular  diastolic  parameters are consistent with Grade III diastolic dysfunction  (restrictive).   2. Right ventricular systolic function is normal. The right ventricular  size is normal. There is normal pulmonary artery systolic pressure.   3. Left atrial size was moderately dilated.   4. The mitral valve is degenerative. Mild mitral valve regurgitation. No  evidence of mitral stenosis.   5. The aortic valve is tricuspid. There is mild calcification of the  aortic valve. There is mild thickening of the aortic valve. Aortic valve  regurgitation is mild. No aortic stenosis is present.   6. The inferior vena cava is dilated in size with >50% respiratory  variability, suggesting right atrial pressure of 8 mmHg.   Comparison(s): No prior Echocardiogram.        Recent Labs: No results found for requested labs within last 365 days.  Recent Lipid Panel No results found for: CHOL, TRIG, HDL, CHOLHDL, VLDL, LDLCALC, LDLDIRECT Dated 05/04/24: cholesterol 178, triglycerides 105, HDL 40, LDL 119.  Risk Assessment/Calculations:    CHA2DS2-VASc Score = 1   This indicates a 0.6% annual risk of stroke. The patient's score is based upon: CHF History: 0 HTN History: 0 Diabetes History: 0 Stroke History: 0 Vascular Disease History: 0 Age Score: 1 Gender Score: 0      HYPERTENSION CONTROL Vitals:   05/19/24 1032 05/19/24 1128  BP: (!) 150/70 (!) 164/56    The patient's blood pressure is elevated above target today.  In order to address the patient's elevated BP: A new medication was prescribed today.            Physical Exam:    VS:  BP (!) 164/56 (BP Location: Right Arm, Cuff Size: Large)   Pulse (!) 54   Ht 6' (1.829 m)   Wt 205 lb (93 kg)   BMI 27.80 kg/m     Wt Readings from Last 3 Encounters:  05/19/24 205 lb (93 kg)  03/25/24 205 lb 12.8 oz (93.4 kg)  01/03/20 200 lb 12.8 oz (91.1 kg)     GEN:  Well nourished, well developed in no acute distress HEENT:  Normal NECK: No JVD; No carotid bruits LYMPHATICS: No lymphadenopathy CARDIAC: RRR, no murmurs, rubs, gallops RESPIRATORY:  Clear to auscultation without rales, wheezing or rhonchi  ABDOMEN: Soft, non-tender, non-distended MUSCULOSKELETAL:  No edema; No deformity  SKIN: Warm and dry NEUROLOGIC:  Alert and oriented x 3 PSYCHIATRIC:  Normal affect   ASSESSMENT:    1. Mitral valve insufficiency and aortic valve insufficiency   2. Paroxysmal atrial fibrillation (HCC)   3. Hypercholesterolemia   4. Primary hypertension    PLAN:    In order of problems listed  above:  Mild MR and AI. Not hemodynamically significant and normal LV function. Would consider repeat Echo in 2-3 years HTN poorly controlled. Unable to titrate Toprol more due to HR. Will add irbesartan  150 mg daily. Encourage DASH type diet. Encourage regular aerobic exercise.  HLD. Likely familial. On high dose Crestor with LDL 119. Recommend coronary calcium score. If he has calcification would refer to Pharm D for Repatha. Paroxysmal AFib. Infrequent. Will continue Toprol for rate control. Currently CHAD vasc of 1. As age increases or if evidence of vascular disease will need to revisit anticoagulation issue           Medication Adjustments/Labs and Tests Ordered: Current medicines are reviewed at length with the patient today.  Concerns regarding medicines are outlined above.  Orders Placed This Encounter  Procedures   CT CARDIAC SCORING (SELF PAY ONLY)   EKG 12-Lead   Meds ordered this encounter  Medications   irbesartan  (AVAPRO ) 150 MG tablet    Sig: Take 1 tablet (150 mg total) by mouth daily.    Dispense:  90 tablet    Refill:  3    Patient Instructions  Medication Instructions:  Start Irbesartan  150 mg daily Continue all other medications   Lab Work: None ordered  Testing/Procedures: Coronary Calcium Score  Follow-Up: At [facility], you and your health needs are our priority.  As part of  our continuing mission to provide you with exceptional heart care, our providers are all part of one team.  This team includes your primary Cardiologist (physician) and Advanced Practice Providers or APPs (Physician Assistants and Nurse Practitioners) who all work together to provide you with the care you need, when you need it.  Your next appointment:  3 months    Provider:  Dr.Marzell Allemand    We recommend signing up for the patient portal called MyChart.  Sign up information is provided on this After Visit Summary.  MyChart is used to connect with patients for Virtual Visits (Telemedicine).  Patients are able to view lab/test results, encounter notes, upcoming appointments, etc.  Non-urgent messages can be sent to your provider as well.   To learn more about what you can do with MyChart, go to [email or web address].      Signed, Rasheena Talmadge, MD  05/19/2024 11:28 AM    [facility]     [1]  Current Meds  Medication Sig   aspirin EC 81 MG tablet Take 81 mg by mouth daily.   CIALIS 5 MG tablet Take 5 mg by mouth daily as needed.   ezetimibe (ZETIA) 10 MG tablet Take 10 mg by mouth daily.   fluvoxaMINE (LUVOX) 50 MG tablet Take 50 mg by mouth 2 (two) times daily.   irbesartan  (AVAPRO ) 150 MG tablet Take 1 tablet (150 mg total) by mouth daily.   metoprolol succinate (TOPROL-XL) 25 MG 24 hr tablet Take 25 mg by mouth at bedtime.   rosuvastatin (CRESTOR) 40 MG tablet Take 40 mg by mouth daily.   Testosterone 12.5 MG/ACT (1%) GEL 5 pumps once daily as directed   "

## 2024-05-19 ENCOUNTER — Ambulatory Visit: Attending: Cardiology | Admitting: Cardiology

## 2024-05-19 ENCOUNTER — Encounter: Payer: Self-pay | Admitting: Cardiology

## 2024-05-19 VITALS — BP 164/56 | HR 54 | Ht 72.0 in | Wt 205.0 lb

## 2024-05-19 DIAGNOSIS — I080 Rheumatic disorders of both mitral and aortic valves: Secondary | ICD-10-CM | POA: Diagnosis not present

## 2024-05-19 DIAGNOSIS — I480 Paroxysmal atrial fibrillation: Secondary | ICD-10-CM

## 2024-05-19 DIAGNOSIS — I10 Essential (primary) hypertension: Secondary | ICD-10-CM

## 2024-05-19 DIAGNOSIS — E7800 Pure hypercholesterolemia, unspecified: Secondary | ICD-10-CM

## 2024-05-19 MED ORDER — IRBESARTAN 150 MG PO TABS: 150.0000 mg | ORAL_TABLET | Freq: Every day | ORAL | 3 refills | Status: AC

## 2024-05-19 NOTE — Patient Instructions (Addendum)
 Medication Instructions:  Start Irbesartan  150 mg daily Continue all other medications   Lab Work: None ordered  Testing/Procedures: Coronary Calcium Score  Follow-Up: At [facility], you and your health needs are our priority.  As part of our continuing mission to provide you with exceptional heart care, our providers are all part of one team.  This team includes your primary Cardiologist (physician) and Advanced Practice Providers or APPs (Physician Assistants and Nurse Practitioners) who all work together to provide you with the care you need, when you need it.  Your next appointment:  3 months    Provider:  Dr.Jordan    We recommend signing up for the patient portal called MyChart.  Sign up information is provided on this After Visit Summary.  MyChart is used to connect with patients for Virtual Visits (Telemedicine).  Patients are able to view lab/test results, encounter notes, upcoming appointments, etc.  Non-urgent messages can be sent to your provider as well.   To learn more about what you can do with MyChart, go to [email or web address].

## 2024-05-21 ENCOUNTER — Ambulatory Visit (HOSPITAL_COMMUNITY)
Admission: RE | Admit: 2024-05-21 | Discharge: 2024-05-21 | Disposition: A | Payer: Self-pay | Source: Ambulatory Visit | Attending: Cardiology | Admitting: Cardiology

## 2024-05-21 DIAGNOSIS — I080 Rheumatic disorders of both mitral and aortic valves: Secondary | ICD-10-CM | POA: Insufficient documentation

## 2024-05-21 DIAGNOSIS — E7800 Pure hypercholesterolemia, unspecified: Secondary | ICD-10-CM | POA: Insufficient documentation

## 2024-05-21 DIAGNOSIS — I480 Paroxysmal atrial fibrillation: Secondary | ICD-10-CM | POA: Insufficient documentation

## 2024-05-21 DIAGNOSIS — I10 Essential (primary) hypertension: Secondary | ICD-10-CM | POA: Insufficient documentation

## 2024-05-24 ENCOUNTER — Ambulatory Visit: Payer: Self-pay | Admitting: Cardiology

## 2024-05-25 NOTE — Telephone Encounter (Signed)
 Patient returned RN's call.

## 2024-05-28 ENCOUNTER — Other Ambulatory Visit: Payer: Self-pay

## 2024-05-28 DIAGNOSIS — E7800 Pure hypercholesterolemia, unspecified: Secondary | ICD-10-CM

## 2024-07-06 ENCOUNTER — Ambulatory Visit
